# Patient Record
Sex: Male | Born: 1955 | Race: White | Hispanic: No | Marital: Single | Smoking: Former smoker
Health system: Southern US, Community
[De-identification: ages and names within clinical notes are randomized; demographics above are authoritative.]

## PROBLEM LIST (undated history)

## (undated) DIAGNOSIS — R600 Localized edema: Secondary | ICD-10-CM

## (undated) DIAGNOSIS — Z7901 Long term (current) use of anticoagulants: Secondary | ICD-10-CM

## (undated) DIAGNOSIS — J159 Unspecified bacterial pneumonia: Secondary | ICD-10-CM

## (undated) DIAGNOSIS — I4892 Unspecified atrial flutter: Secondary | ICD-10-CM

## (undated) DIAGNOSIS — I5020 Unspecified systolic (congestive) heart failure: Secondary | ICD-10-CM

## (undated) DIAGNOSIS — R0602 Shortness of breath: Secondary | ICD-10-CM

## (undated) HISTORY — DX: Unspecified atrial flutter: I48.92

## (undated) HISTORY — PX: ELBOW SURGERY: SHX618

## (undated) HISTORY — DX: Long term (current) use of anticoagulants: Z79.01

## (undated) HISTORY — DX: Unspecified bacterial pneumonia: J15.9

## (undated) HISTORY — DX: Unspecified systolic (congestive) heart failure: I50.20

## (undated) HISTORY — DX: Shortness of breath: R06.02

## (undated) HISTORY — DX: Localized edema: R60.0

---

## 2001-04-26 ENCOUNTER — Inpatient Hospital Stay (HOSPITAL_COMMUNITY): Admission: EM | Admit: 2001-04-26 | Discharge: 2001-04-28 | Payer: Self-pay | Admitting: Internal Medicine

## 2001-04-26 ENCOUNTER — Encounter: Payer: Self-pay | Admitting: Emergency Medicine

## 2010-10-24 ENCOUNTER — Encounter: Payer: Self-pay | Admitting: Internal Medicine

## 2012-03-19 ENCOUNTER — Ambulatory Visit: Payer: Self-pay

## 2013-12-22 IMAGING — CR DG SHOULDER 3+V*L*
1 series · 4 of 4 positions shown · non-contrast
Comparison: none

COMMENTS:

PROCEDURE:     DXR - DXR SHOULDER LEFT COMPLETE  - March 19, 2012  [DATE]
RESULT:     No acute fracture or dislocation is seen. There is an old healed
fracture deformity of the proximal left humerus at the humeral neck. No
arthritic changes about the shoulder joint are seen. No lytic or blastic
lesions are noted.

[Series 1: w shoulder external left · 0.14mm/px · 4 of 4 slices shown]
[im 1/4]
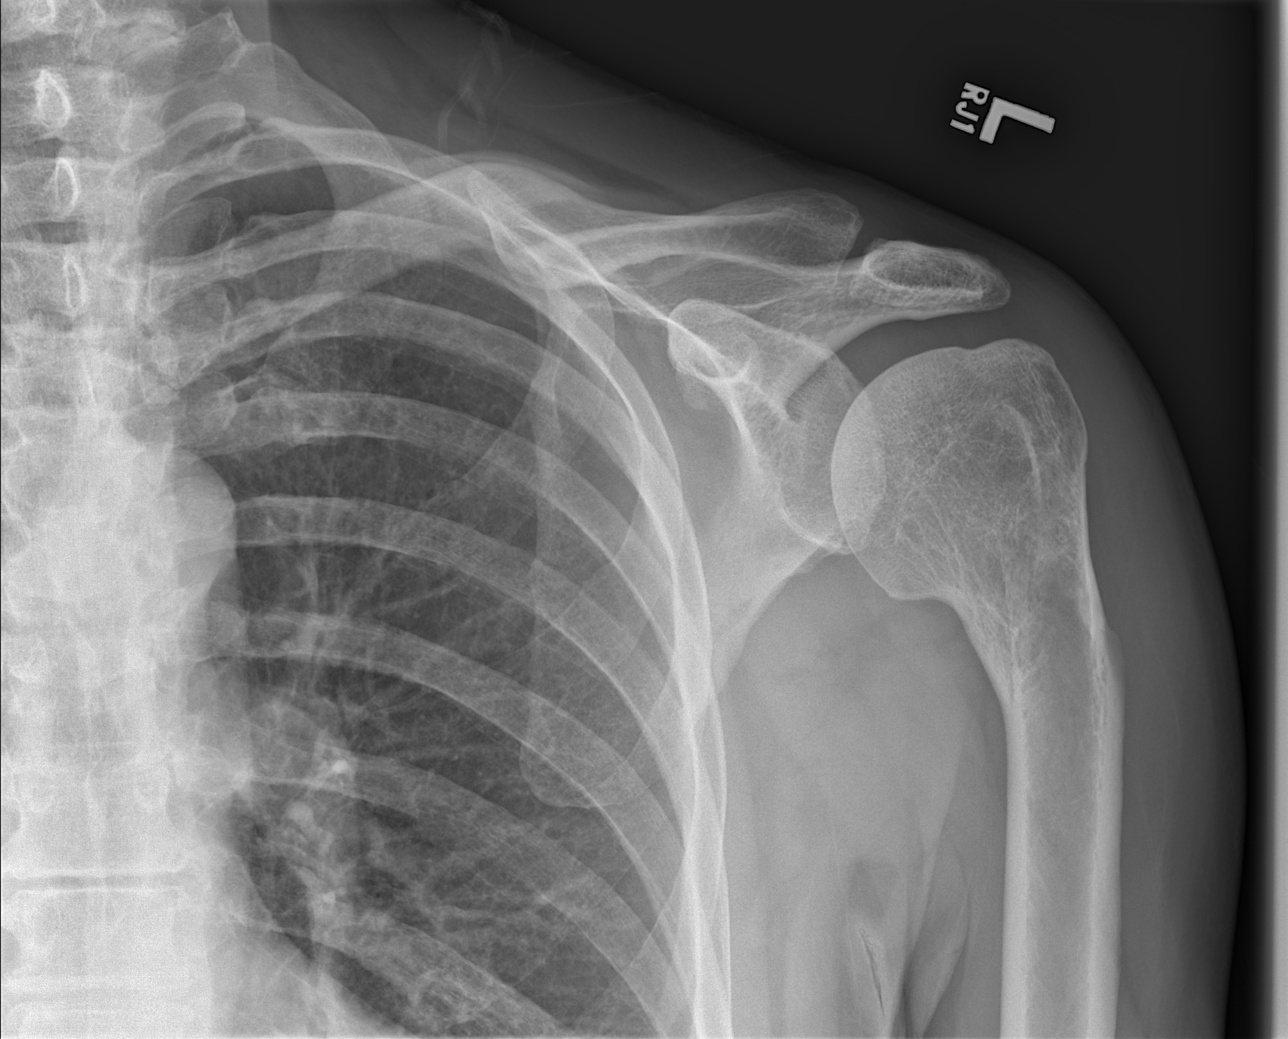
[im 2/4]
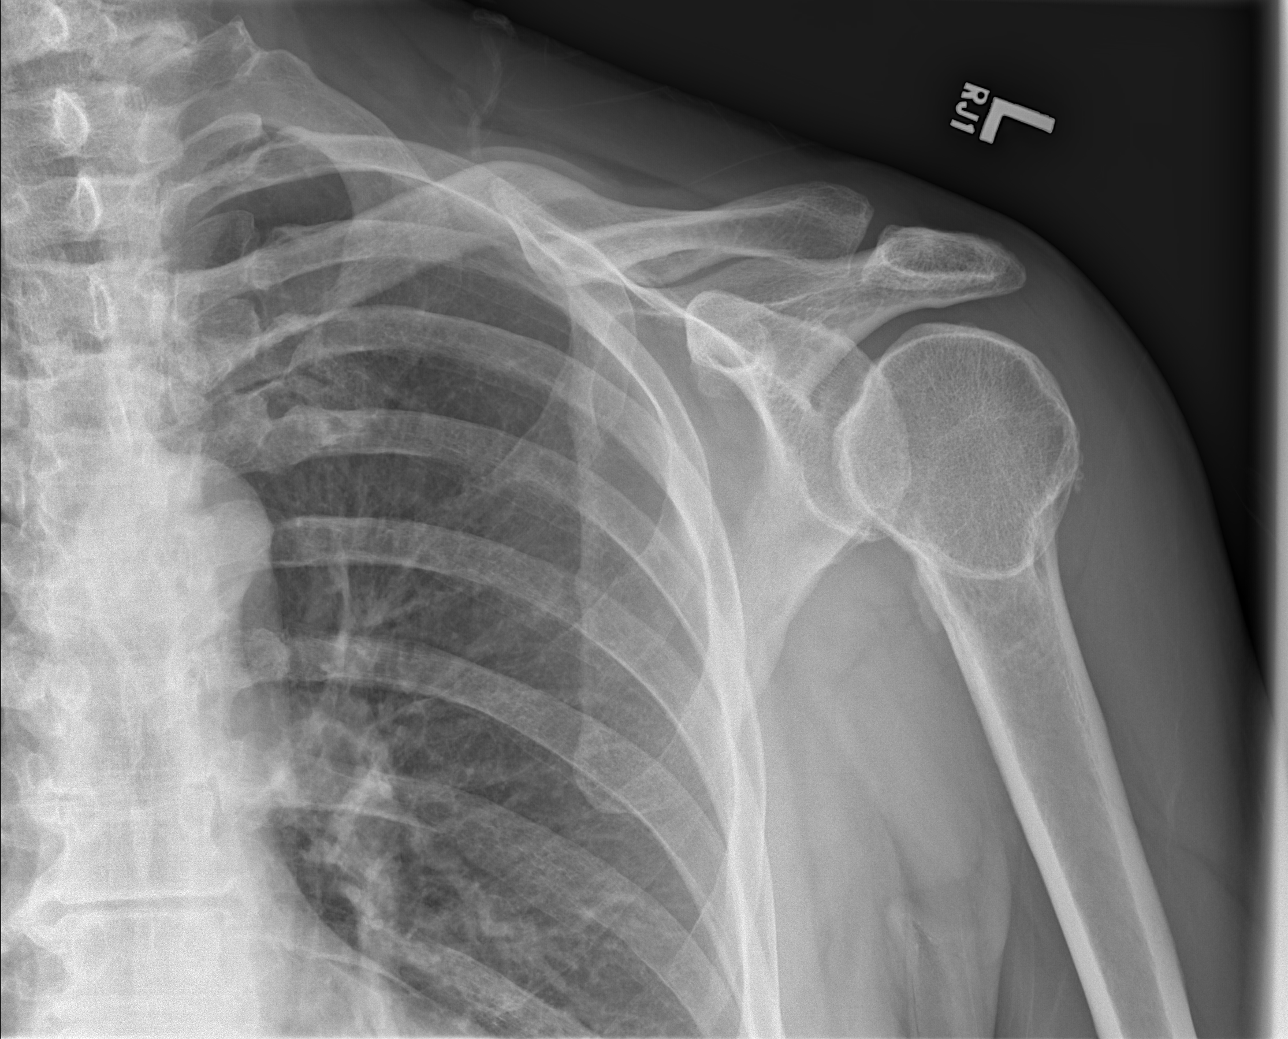
[im 3/4]
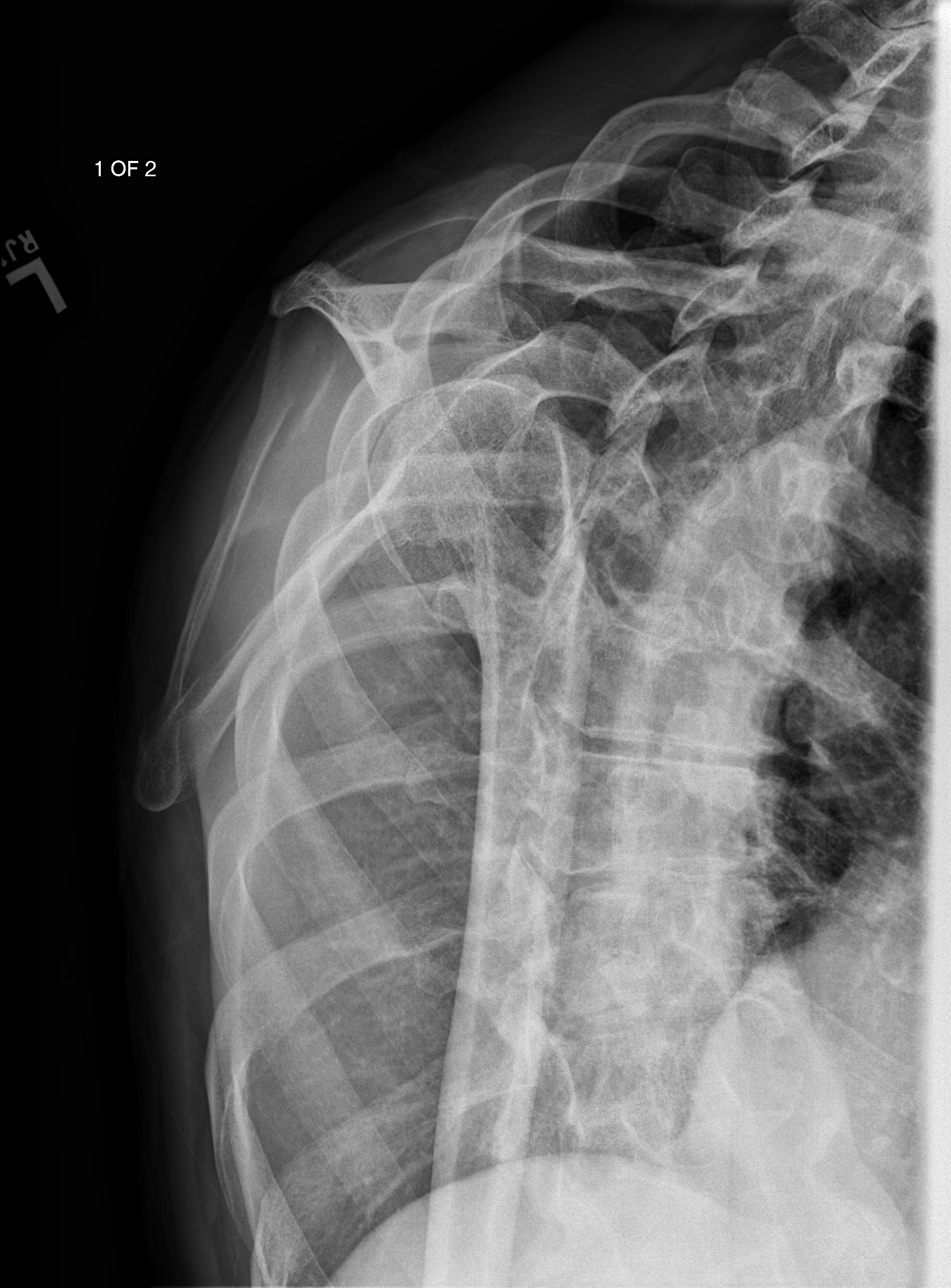
[im 4/4]
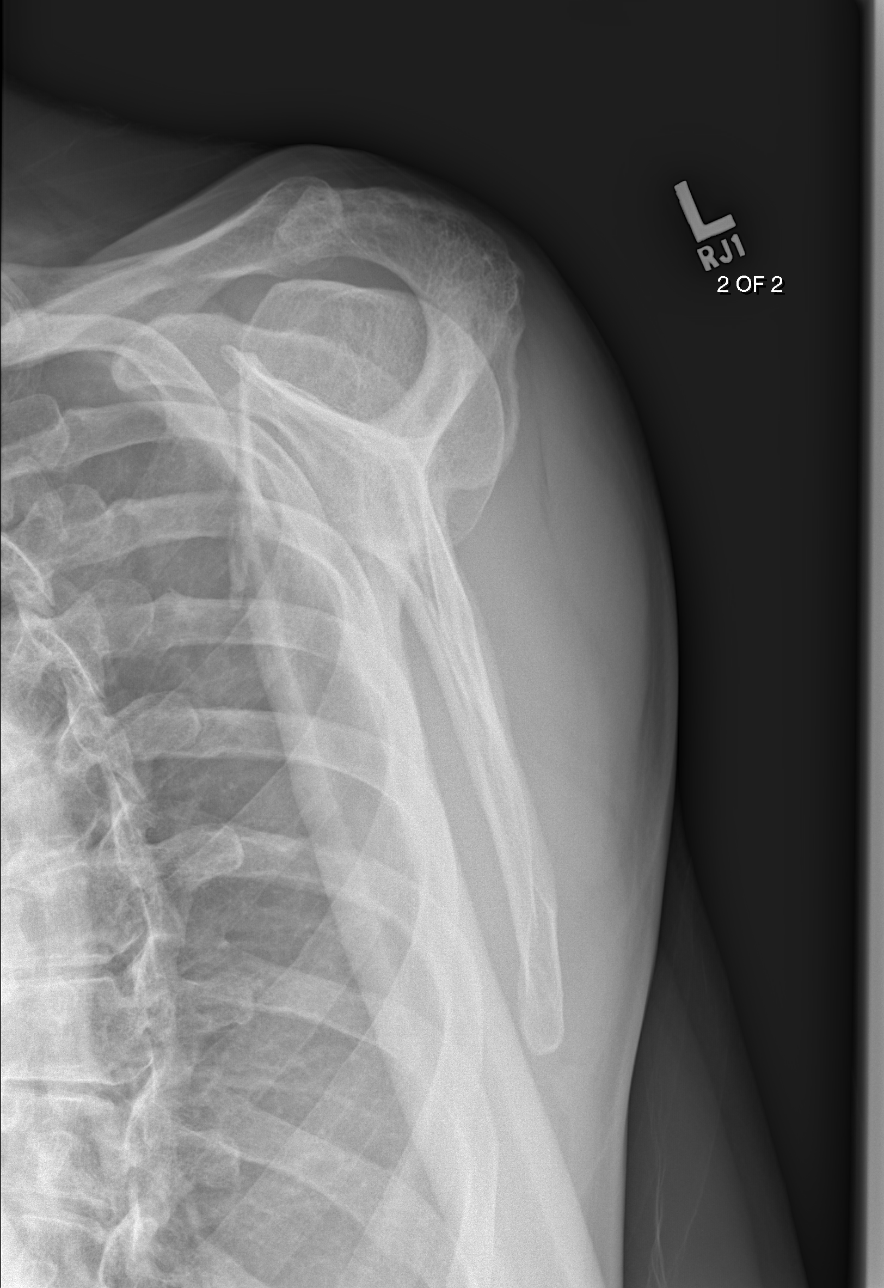

[4 of 4 positions shown; findings below may reference images not displayed]

IMPRESSION: 1. No acute bony abnormalities are identified.
2. There is an old fracture of the proximal left humerus that has now
healed. There is mild residual varus angulation at the fracture site.

[REDACTED]

## 2018-04-09 DIAGNOSIS — I5020 Unspecified systolic (congestive) heart failure: Secondary | ICD-10-CM

## 2018-04-09 DIAGNOSIS — J189 Pneumonia, unspecified organism: Secondary | ICD-10-CM

## 2018-04-09 DIAGNOSIS — J449 Chronic obstructive pulmonary disease, unspecified: Secondary | ICD-10-CM

## 2018-04-09 DIAGNOSIS — I509 Heart failure, unspecified: Secondary | ICD-10-CM

## 2018-04-09 DIAGNOSIS — E871 Hypo-osmolality and hyponatremia: Secondary | ICD-10-CM

## 2018-04-09 DIAGNOSIS — J159 Unspecified bacterial pneumonia: Secondary | ICD-10-CM

## 2018-04-09 DIAGNOSIS — I4892 Unspecified atrial flutter: Secondary | ICD-10-CM

## 2018-04-11 DIAGNOSIS — I509 Heart failure, unspecified: Secondary | ICD-10-CM

## 2018-04-11 DIAGNOSIS — Z7901 Long term (current) use of anticoagulants: Secondary | ICD-10-CM

## 2018-04-14 DIAGNOSIS — I4892 Unspecified atrial flutter: Secondary | ICD-10-CM

## 2018-04-14 DIAGNOSIS — I509 Heart failure, unspecified: Secondary | ICD-10-CM

## 2018-05-08 DIAGNOSIS — I5020 Unspecified systolic (congestive) heart failure: Secondary | ICD-10-CM

## 2018-05-08 DIAGNOSIS — R600 Localized edema: Secondary | ICD-10-CM

## 2018-05-08 DIAGNOSIS — J159 Unspecified bacterial pneumonia: Secondary | ICD-10-CM

## 2018-05-08 DIAGNOSIS — R0602 Shortness of breath: Secondary | ICD-10-CM

## 2018-05-08 DIAGNOSIS — I4892 Unspecified atrial flutter: Secondary | ICD-10-CM

## 2018-05-08 DIAGNOSIS — Z7901 Long term (current) use of anticoagulants: Secondary | ICD-10-CM | POA: Insufficient documentation

## 2018-05-08 HISTORY — DX: Long term (current) use of anticoagulants: Z79.01

## 2018-05-08 HISTORY — DX: Unspecified systolic (congestive) heart failure: I50.20

## 2018-05-08 HISTORY — DX: Localized edema: R60.0

## 2018-05-08 HISTORY — DX: Unspecified bacterial pneumonia: J15.9

## 2018-05-08 HISTORY — DX: Unspecified atrial flutter: I48.92

## 2018-05-08 HISTORY — DX: Shortness of breath: R06.02

## 2018-05-08 NOTE — Progress Notes (Signed)
Cardiology Office Note:    Date:  05/09/2018   ID:  Randall Craig, DOB January 06, 1956, MRN [id number]  PCP:  Patient, No Pcp Per  Cardiologist:  Norman Herrlich, MD   Referring MD: No ref. provider found  ASSESSMENT:    1. Atrial flutter with rapid ventricular response (HCC)   2. Anticoagulant long-term use   3. Chronic systolic congestive heart failure (HCC)   4. High risk medication use    PLAN:    In order of problems listed above:  1. Improved he is converted spontaneously to continue his anticoagulant beta-blocker decrease digoxin to 3 days a week and plan to discontinue at next visit if he maintains sinus rhythm. 2. Stable continue his anticoagulant 3. Stable no evidence of volume overload continue diuretic beta-blocker ACE inhibitor check renal function proBNP and consider echocardiogram at next visit 4. Digoxin reduced dose no evidence of toxicity  Next appointment 2 months   Medication Adjustments/Labs and Tests Ordered: Current medicines are reviewed at length with the patient today.  Concerns regarding medicines are outlined above.  Orders Placed This Encounter  Procedures  . Basic metabolic panel  . Pro b natriuretic peptide (BNP)  . Digoxin level  . EKG 12-Lead   Meds ordered this encounter  Medications  . digoxin (LANOXIN) 0.25 MG tablet    Sig: Take 1 tablet (250 mcg total) by mouth 3 (three) times a week.    Dispense:  30 tablet    Refill:  6     Chief Complaint  Patient presents with  . New Patient (Initial Visit)  . Atrial Fibrillation  . Congestive Heart Failure  . Anticoagulation    History of Present Illness:    Randall Craig is a 62 y.o. male who is being seen today for the evaluation of atrial flutter and heart failure at the request of No ref. provider found.  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  During  that hospitalization he declined TEE guided cardioversion his heart failure was treated compensated he was discharged from the hospital anticoagulated with plans to return for elective outpatient cardioversion.  Prior to discharge his CBC was normal hemoglobin 15.3 renal function normal except for sodium of 134 proBNP level is elevated 2670.  Digoxin level therapeutic 0.7.  He feels improved no shortness of breath chest pain palpitation or syncope and is in sinus rhythm today.  His greatest problem is lack of home health care benefits and resources to buy medications. Past Medical History:  Diagnosis Date  . Anticoagulant long-term use 05/08/2018  . Atrial flutter with rapid ventricular response (HCC) 05/08/2018  . Community acquired bacterial pneumonia 05/08/2018  . Lower extremity edema 05/08/2018  . Shortness of breath 05/08/2018  . Systolic congestive heart failure (HCC) 05/08/2018    Past Surgical History:  Procedure Laterality Date  . ELBOW SURGERY      Current Medications: Current Meds  Medication Sig  . digoxin (LANOXIN) 0.25 MG tablet Take 1 tablet (250 mcg total) by mouth 3 (three) times a week.  Marland Kitchen ELIQUIS 5 MG TABS tablet Take 5 mg by mouth 2 (two) times daily.  . furosemide (LASIX) 40 MG tablet Take 40 mg by mouth daily.  Marland Kitchen lisinopril (PRINIVIL,ZESTRIL) 2.5 MG tablet Take 2.5 mg by mouth daily.  . metoprolol tartrate (LOPRESSOR) 50 MG tablet Take 50 mg by mouth every 6 (six) hours.  Marland Kitchen spironolactone (  ALDACTONE) 25 MG tablet Take 25 mg by mouth daily.  . [DISCONTINUED] digoxin (LANOXIN) 0.25 MG tablet Take 250 mcg by mouth daily.     Allergies:   Acetaminophen; Atorvastatin; and Serotonin reuptake inhibitors (ssris)   Social History   Socioeconomic History  . Marital status: Single    Spouse name: Not on file  . Number of children: Not on file  . Years of education: Not on file  . Highest education level: Not on file  Occupational History  . Not on file  Social Needs  .  Financial resource strain: Not on file  . Food insecurity:    Worry: Not on file    Inability: Not on file  . Transportation needs:    Medical: Not on file    Non-medical: Not on file  Tobacco Use  . Smoking status: Former Games developermoker  . Smokeless tobacco: Never Used  Substance and Sexual Activity  . Alcohol use: Yes    Comment: very rarely   . Drug use: Not Currently  . Sexual activity: Not on file  Lifestyle  . Physical activity:    Days per week: Not on file    Minutes per session: Not on file  . Stress: Not on file  Relationships  . Social connections:    Talks on phone: Not on file    Gets together: Not on file    Attends religious service: Not on file    Active member of club or organization: Not on file    Attends meetings of clubs or organizations: Not on file    Relationship status: Not on file  Other Topics Concern  . Not on file  Social History Narrative  . Not on file     Family History: The patient's family history includes Congestive Heart Failure in his father.  ROS:   ROS Please see the history of present illness.     All other systems reviewed and are negative.  EKGs/Labs/Other Studies Reviewed:    The following studies were reviewed today:   EKG:  EKG is  ordered today.  The ekg ordered today demonstrates SRTH nonspecific ST depression  Recent Labs: No results found for requested labs within last 8760 hours.  Recent Lipid Panel No results found for: CHOL, TRIG, HDL, CHOLHDL, VLDL, LDLCALC, LDLDIRECT  Physical Exam:    VS:  BP (!) 142/94 (BP Location: Right Arm, Patient Position: Sitting, Cuff Size: Normal)   Pulse 78   Ht 6\' 2"  (1.88 m)   Wt 190 lb (86.2 kg)   SpO2 97%   BMI 24.39 kg/m     Wt Readings from Last 3 Encounters:  05/09/18 190 lb (86.2 kg)     GEN:  Well nourished, well developed in no acute distress HEENT: Normal NECK: No JVD; No carotid bruits LYMPHATICS: No lymphadenopathy CARDIAC: RRR, no murmurs, rubs,  gallops RESPIRATORY:  Clear to auscultation without rales, wheezing or rhonchi  ABDOMEN: Soft, non-tender, non-distended MUSCULOSKELETAL:  No edema; No deformity  SKIN: Warm and dry NEUROLOGIC:  Alert and oriented x 3 PSYCHIATRIC:  Normal affect     Signed, Norman HerrlichBrian Milyn Stapleton, MD  05/09/2018 3:09 PM    [facility]

## 2018-05-09 ENCOUNTER — Encounter: Payer: Self-pay | Admitting: Cardiology

## 2018-05-09 ENCOUNTER — Ambulatory Visit (INDEPENDENT_AMBULATORY_CARE_PROVIDER_SITE_OTHER): Payer: Self-pay | Admitting: Cardiology

## 2018-05-09 VITALS — BP 142/94 | HR 78 | Ht 74.0 in | Wt 190.0 lb

## 2018-05-09 DIAGNOSIS — Z7901 Long term (current) use of anticoagulants: Secondary | ICD-10-CM

## 2018-05-09 DIAGNOSIS — I5022 Chronic systolic (congestive) heart failure: Secondary | ICD-10-CM

## 2018-05-09 DIAGNOSIS — I4892 Unspecified atrial flutter: Secondary | ICD-10-CM

## 2018-05-09 DIAGNOSIS — Z79899 Other long term (current) drug therapy: Secondary | ICD-10-CM | POA: Insufficient documentation

## 2018-05-09 MED ORDER — DIGOXIN 250 MCG PO TABS
250.0000 ug | ORAL_TABLET | ORAL | 6 refills | Status: DC
Start: 2018-05-09 — End: 2019-05-07

## 2018-05-09 NOTE — Patient Instructions (Signed)
Medication Instructions:  Your physician has recommended you make the following change in your medication:   DECREASE: Digoxin 0.25mg s to Monday, Wednesday, and Friday   Labwork: Your physician recommends that you return for lab work today: BMP, BNP, and Digoxin level.  Testing/Procedures: None.  Follow-Up: Your physician recommends that you schedule a follow-up appointment in: 2 months.    Any Other Special Instructions Will Be Listed Below (If Applicable).     If you need a refill on your cardiac medications before your next appointment, please call your pharmacy.

## 2018-05-10 LAB — BASIC METABOLIC PANEL
BUN/Creatinine Ratio: 12 (ref 10–24)
BUN: 11 mg/dL (ref 8–27)
CO2: 25 mmol/L (ref 20–29)
CREATININE: 0.92 mg/dL (ref 0.76–1.27)
Calcium: 9.6 mg/dL (ref 8.6–10.2)
Chloride: 101 mmol/L (ref 96–106)
GFR calc Af Amer: 103 mL/min/{1.73_m2} (ref >59)
GFR calc non Af Amer: 89 mL/min/{1.73_m2} (ref >59)
GLUCOSE: 90 mg/dL (ref 65–99)
Potassium: 4.7 mmol/L (ref 3.5–5.2)
SODIUM: 141 mmol/L (ref 134–144)

## 2018-05-10 LAB — PRO B NATRIURETIC PEPTIDE: NT-PRO BNP: 513 pg/mL — AB (ref 0–210)

## 2018-05-10 LAB — DIGOXIN LEVEL: DIGOXIN, SERUM: 1 ng/mL — AB (ref 0.5–0.9)

## 2018-07-16 NOTE — Progress Notes (Deleted)
Cardiology Office Note:    Date:  07/16/2018   ID:  Randall Craig, DOB 04/20/1956, MRN [id number]  PCP:  Patient, No Pcp Per  Cardiologist:  Norman Herrlich, MD    Referring MD: No ref. provider found    ASSESSMENT:    No diagnosis found. PLAN:    In order of problems listed above:  1. ***   Next appointment: ***   Medication Adjustments/Labs and Tests Ordered: Current medicines are reviewed at length with the patient today.  Concerns regarding medicines are outlined above.  No orders of the defined types were placed in this encounter.  No orders of the defined types were placed in this encounter.   No chief complaint on file.   History of Present Illness:    Randall Craig is a 62 y.o. male with a hx of paroxysmal atrial flutter tachycardia induced cardiomyopathy and heart failure last seen 05/09/2018.  He was seen at [facility] discharge 04/16/2018 he had pneumonia heart failure atypical atrial flutter rapid rates and severe cardiomyopathy ejection fraction less than 20%.  During that hospitalization he declined TEE guided cardioversion his heart failure was treated compensated he was discharged from the hospital anticoagulated with plans to return for elective outpatient cardioversion.  Prior to discharge his CBC was normal hemoglobin 15.3 renal function normal except for sodium of 134 proBNP level is elevated 2670.  Digoxin level therapeutic 0.7.   Compliance with diet, lifestyle and medications: *** Past Medical History:  Diagnosis Date  . Anticoagulant long-term use 05/08/2018  . Atrial flutter with rapid ventricular response (HCC) 05/08/2018  . Community acquired bacterial pneumonia 05/08/2018  . Lower extremity edema 05/08/2018  . Shortness of breath 05/08/2018  . Systolic congestive heart failure (HCC) 05/08/2018    Past Surgical History:  Procedure Laterality Date  . ELBOW SURGERY      Current Medications: No outpatient medications have been marked as taking  for the 07/17/18 encounter (Appointment) with Baldo Daub, MD.     Allergies:   Acetaminophen; Atorvastatin; and Serotonin reuptake inhibitors (ssris)   Social History   Socioeconomic History  . Marital status: Single    Spouse name: Not on file  . Number of children: Not on file  . Years of education: Not on file  . Highest education level: Not on file  Occupational History  . Not on file  Social Needs  . Financial resource strain: Not on file  . Food insecurity:    Worry: Not on file    Inability: Not on file  . Transportation needs:    Medical: Not on file    Non-medical: Not on file  Tobacco Use  . Smoking status: Former Games developer  . Smokeless tobacco: Never Used  Substance and Sexual Activity  . Alcohol use: Yes    Comment: very rarely   . Drug use: Not Currently  . Sexual activity: Not on file  Lifestyle  . Physical activity:    Days per week: Not on file    Minutes per session: Not on file  . Stress: Not on file  Relationships  . Social connections:    Talks on phone: Not on file    Gets together: Not on file    Attends religious service: Not on file    Active member of club or organization: Not on file    Attends meetings of clubs or organizations: Not on file    Relationship status: Not on file  Other Topics Concern  . Not on  file  Social History Narrative  . Not on file     Family History: The patient's ***family history includes Congestive Heart Failure in his father. ROS:   Please see the history of present illness.    All other systems reviewed and are negative.  EKGs/Labs/Other Studies Reviewed:    The following studies were reviewed today:  EKG:  EKG ordered today.  The ekg ordered today demonstrates ***  Recent Labs: 05/09/2018: BUN 11; Creatinine, Ser 0.92; NT-Pro BNP 513; Potassium 4.7; Sodium 141  Recent Lipid Panel No results found for: CHOL, TRIG, HDL, CHOLHDL, VLDL, LDLCALC, LDLDIRECT  Physical Exam:    VS:  There were no  vitals taken for this visit.    Wt Readings from Last 3 Encounters:  05/09/18 190 lb (86.2 kg)     GEN: *** Well nourished, well developed in no acute distress HEENT: Normal NECK: No JVD; No carotid bruits LYMPHATICS: No lymphadenopathy CARDIAC: ***RRR, no murmurs, rubs, gallops RESPIRATORY:  Clear to auscultation without rales, wheezing or rhonchi  ABDOMEN: Soft, non-tender, non-distended MUSCULOSKELETAL:  No edema; No deformity  SKIN: Warm and dry NEUROLOGIC:  Alert and oriented x 3 PSYCHIATRIC:  Normal affect    Signed, Norman Herrlich, MD  07/16/2018 5:44 PM    [facility]

## 2018-07-17 ENCOUNTER — Telehealth: Payer: Self-pay | Admitting: Cardiology

## 2018-07-17 ENCOUNTER — Ambulatory Visit: Payer: Self-pay | Admitting: Cardiology

## 2018-07-17 NOTE — Telephone Encounter (Signed)
Patient came in for appt with Dr Dulce Sellar and you were in the [facility] office due to schedule change and patient was unable to be contacted due to homeless.  I offered to resschedule appt and dhe declined because he states that he cant continue the meds due to financial issues.  I mentioned to him going to the [facility] and reaching iut to them for help with meds but he declined and states that the hospital tried to also but they fell thru with the paperwork. I just wanted to let you know.Marland Kitchen

## 2019-03-22 ENCOUNTER — Other Ambulatory Visit: Payer: Self-pay | Admitting: Cardiology

## 2019-03-22 NOTE — Telephone Encounter (Signed)
Lisinopril refill sent to [facility] per request.

## 2019-05-04 ENCOUNTER — Other Ambulatory Visit: Payer: Self-pay | Admitting: Cardiology

## 2023-06-11 DIAGNOSIS — I5041 Acute combined systolic (congestive) and diastolic (congestive) heart failure: Secondary | ICD-10-CM | POA: Diagnosis not present

## 2023-06-11 DIAGNOSIS — I4892 Unspecified atrial flutter: Secondary | ICD-10-CM | POA: Diagnosis not present

## 2023-06-11 DIAGNOSIS — N179 Acute kidney failure, unspecified: Secondary | ICD-10-CM | POA: Diagnosis not present

## 2023-06-12 DIAGNOSIS — I4892 Unspecified atrial flutter: Secondary | ICD-10-CM | POA: Diagnosis not present

## 2023-06-12 DIAGNOSIS — I483 Typical atrial flutter: Secondary | ICD-10-CM

## 2023-06-12 DIAGNOSIS — I340 Nonrheumatic mitral (valve) insufficiency: Secondary | ICD-10-CM | POA: Diagnosis not present

## 2023-06-12 DIAGNOSIS — I429 Cardiomyopathy, unspecified: Secondary | ICD-10-CM | POA: Diagnosis not present

## 2023-06-12 DIAGNOSIS — I272 Pulmonary hypertension, unspecified: Secondary | ICD-10-CM

## 2023-06-12 DIAGNOSIS — I5022 Chronic systolic (congestive) heart failure: Secondary | ICD-10-CM | POA: Diagnosis not present

## 2023-06-12 DIAGNOSIS — N179 Acute kidney failure, unspecified: Secondary | ICD-10-CM

## 2023-06-12 DIAGNOSIS — Z7901 Long term (current) use of anticoagulants: Secondary | ICD-10-CM

## 2023-06-12 DIAGNOSIS — I071 Rheumatic tricuspid insufficiency: Secondary | ICD-10-CM

## 2023-06-13 DIAGNOSIS — I429 Cardiomyopathy, unspecified: Secondary | ICD-10-CM | POA: Diagnosis not present

## 2023-06-13 DIAGNOSIS — I5022 Chronic systolic (congestive) heart failure: Secondary | ICD-10-CM | POA: Diagnosis not present

## 2023-06-13 DIAGNOSIS — I340 Nonrheumatic mitral (valve) insufficiency: Secondary | ICD-10-CM | POA: Diagnosis not present

## 2023-06-13 DIAGNOSIS — I4892 Unspecified atrial flutter: Secondary | ICD-10-CM | POA: Diagnosis not present

## 2023-06-14 DIAGNOSIS — I340 Nonrheumatic mitral (valve) insufficiency: Secondary | ICD-10-CM | POA: Diagnosis not present

## 2023-06-14 DIAGNOSIS — I5022 Chronic systolic (congestive) heart failure: Secondary | ICD-10-CM | POA: Diagnosis not present

## 2023-06-14 DIAGNOSIS — I4892 Unspecified atrial flutter: Secondary | ICD-10-CM | POA: Diagnosis not present

## 2023-06-14 DIAGNOSIS — I429 Cardiomyopathy, unspecified: Secondary | ICD-10-CM | POA: Diagnosis not present

## 2023-06-15 DIAGNOSIS — I4892 Unspecified atrial flutter: Secondary | ICD-10-CM | POA: Diagnosis not present

## 2023-06-15 DIAGNOSIS — I429 Cardiomyopathy, unspecified: Secondary | ICD-10-CM | POA: Diagnosis not present

## 2023-06-15 DIAGNOSIS — I483 Typical atrial flutter: Secondary | ICD-10-CM | POA: Diagnosis not present

## 2023-06-15 DIAGNOSIS — I5022 Chronic systolic (congestive) heart failure: Secondary | ICD-10-CM | POA: Diagnosis not present

## 2023-06-15 DIAGNOSIS — I340 Nonrheumatic mitral (valve) insufficiency: Secondary | ICD-10-CM | POA: Diagnosis not present

## 2023-06-16 DIAGNOSIS — I429 Cardiomyopathy, unspecified: Secondary | ICD-10-CM | POA: Diagnosis not present

## 2023-06-16 DIAGNOSIS — I4892 Unspecified atrial flutter: Secondary | ICD-10-CM | POA: Diagnosis not present

## 2023-06-16 DIAGNOSIS — I5022 Chronic systolic (congestive) heart failure: Secondary | ICD-10-CM | POA: Diagnosis not present

## 2023-06-16 DIAGNOSIS — I340 Nonrheumatic mitral (valve) insufficiency: Secondary | ICD-10-CM | POA: Diagnosis not present

## 2023-06-17 DIAGNOSIS — I4892 Unspecified atrial flutter: Secondary | ICD-10-CM | POA: Diagnosis not present

## 2023-06-17 DIAGNOSIS — I429 Cardiomyopathy, unspecified: Secondary | ICD-10-CM | POA: Diagnosis not present

## 2023-06-17 DIAGNOSIS — I340 Nonrheumatic mitral (valve) insufficiency: Secondary | ICD-10-CM | POA: Diagnosis not present

## 2023-06-17 DIAGNOSIS — J449 Chronic obstructive pulmonary disease, unspecified: Secondary | ICD-10-CM | POA: Diagnosis not present

## 2023-06-18 DIAGNOSIS — I429 Cardiomyopathy, unspecified: Secondary | ICD-10-CM | POA: Diagnosis not present

## 2023-06-18 DIAGNOSIS — J449 Chronic obstructive pulmonary disease, unspecified: Secondary | ICD-10-CM | POA: Diagnosis not present

## 2023-06-18 DIAGNOSIS — I4892 Unspecified atrial flutter: Secondary | ICD-10-CM | POA: Diagnosis not present

## 2023-06-18 DIAGNOSIS — I340 Nonrheumatic mitral (valve) insufficiency: Secondary | ICD-10-CM | POA: Diagnosis not present

## 2023-06-19 DIAGNOSIS — I959 Hypotension, unspecified: Secondary | ICD-10-CM | POA: Diagnosis not present

## 2023-06-19 DIAGNOSIS — I509 Heart failure, unspecified: Secondary | ICD-10-CM | POA: Diagnosis not present

## 2023-06-19 DIAGNOSIS — Z5900 Homelessness unspecified: Secondary | ICD-10-CM | POA: Diagnosis not present

## 2023-06-19 DIAGNOSIS — I340 Nonrheumatic mitral (valve) insufficiency: Secondary | ICD-10-CM | POA: Diagnosis not present

## 2023-06-20 DIAGNOSIS — Z5900 Homelessness unspecified: Secondary | ICD-10-CM | POA: Diagnosis not present

## 2023-06-20 DIAGNOSIS — I959 Hypotension, unspecified: Secondary | ICD-10-CM | POA: Diagnosis not present

## 2023-06-20 DIAGNOSIS — I340 Nonrheumatic mitral (valve) insufficiency: Secondary | ICD-10-CM | POA: Diagnosis not present

## 2023-06-20 DIAGNOSIS — I509 Heart failure, unspecified: Secondary | ICD-10-CM | POA: Diagnosis not present

## 2023-06-21 DIAGNOSIS — Z5900 Homelessness unspecified: Secondary | ICD-10-CM | POA: Diagnosis not present

## 2023-06-21 DIAGNOSIS — I509 Heart failure, unspecified: Secondary | ICD-10-CM | POA: Diagnosis not present

## 2023-06-21 DIAGNOSIS — I340 Nonrheumatic mitral (valve) insufficiency: Secondary | ICD-10-CM | POA: Diagnosis not present

## 2023-06-21 DIAGNOSIS — I959 Hypotension, unspecified: Secondary | ICD-10-CM | POA: Diagnosis not present

## 2023-06-22 DIAGNOSIS — I959 Hypotension, unspecified: Secondary | ICD-10-CM | POA: Diagnosis not present

## 2023-06-22 DIAGNOSIS — I509 Heart failure, unspecified: Secondary | ICD-10-CM | POA: Diagnosis not present

## 2023-06-22 DIAGNOSIS — Z5900 Homelessness unspecified: Secondary | ICD-10-CM | POA: Diagnosis not present

## 2023-06-22 DIAGNOSIS — I4892 Unspecified atrial flutter: Secondary | ICD-10-CM | POA: Diagnosis not present

## 2023-06-22 DIAGNOSIS — I340 Nonrheumatic mitral (valve) insufficiency: Secondary | ICD-10-CM | POA: Diagnosis not present

## 2023-06-23 DIAGNOSIS — I340 Nonrheumatic mitral (valve) insufficiency: Secondary | ICD-10-CM | POA: Diagnosis not present

## 2023-06-23 DIAGNOSIS — I959 Hypotension, unspecified: Secondary | ICD-10-CM | POA: Diagnosis not present

## 2023-06-23 DIAGNOSIS — I509 Heart failure, unspecified: Secondary | ICD-10-CM | POA: Diagnosis not present

## 2023-06-23 DIAGNOSIS — Z5900 Homelessness unspecified: Secondary | ICD-10-CM | POA: Diagnosis not present

## 2023-06-26 ENCOUNTER — Encounter: Payer: Self-pay | Admitting: Cardiology

## 2023-07-05 ENCOUNTER — Telehealth: Payer: Self-pay | Admitting: *Deleted

## 2023-07-05 NOTE — Telephone Encounter (Signed)
Irving Burton form [employer] stopped by to let us know she hadn't gotten any downloads on this pt lately and that the last readings showed PVC's and V-Tach. Let her know that pt is at [facility] in [facility]. She thanked me and said she would keep abreast of his care.

## 2023-07-12 ENCOUNTER — Encounter: Payer: Self-pay | Admitting: Family Medicine
